# Patient Record
Sex: Male | Born: 1995 | Race: White | Hispanic: No | Marital: Single | Smoking: Never smoker
Health system: Southern US, Community
[De-identification: ages and names within clinical notes are randomized; demographics above are authoritative.]

## PROBLEM LIST (undated history)

## (undated) DIAGNOSIS — R569 Unspecified convulsions: Secondary | ICD-10-CM

---
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
## 2016-05-31 ENCOUNTER — Emergency Department (HOSPITAL_COMMUNITY)
Admission: EM | Admit: 2016-05-31 | Discharge: 2016-05-31 | Disposition: A | Discharge status: Home / Self Care | Payer: BLUE CROSS/BLUE SHIELD | Attending: Emergency Medicine | Admitting: Emergency Medicine

## 2016-05-31 ENCOUNTER — Emergency Department (HOSPITAL_COMMUNITY): Payer: BLUE CROSS/BLUE SHIELD

## 2016-05-31 ENCOUNTER — Encounter (HOSPITAL_COMMUNITY): Payer: Self-pay | Admitting: Emergency Medicine

## 2016-05-31 DIAGNOSIS — R000 Tachycardia, unspecified: Secondary | ICD-10-CM

## 2016-05-31 DIAGNOSIS — Z79899 Other long term (current) drug therapy: Secondary | ICD-10-CM | POA: Diagnosis not present

## 2016-05-31 DIAGNOSIS — F419 Anxiety disorder, unspecified: Secondary | ICD-10-CM | POA: Diagnosis not present

## 2016-05-31 DIAGNOSIS — R079 Chest pain, unspecified: Secondary | ICD-10-CM | POA: Diagnosis present

## 2016-05-31 HISTORY — DX: Unspecified convulsions: R56.9

## 2016-05-31 LAB — CBC
HCT: 41 % (ref 39.0–52.0)
Hemoglobin: 14 g/dL (ref 13.0–17.0)
MCH: 31.1 pg (ref 26.0–34.0)
MCHC: 34.1 g/dL (ref 30.0–36.0)
MCV: 91.1 fL (ref 78.0–100.0)
PLATELETS: 209 10*3/uL (ref 150–400)
RBC: 4.5 MIL/uL (ref 4.22–5.81)
RDW: 12.5 % (ref 11.5–15.5)
WBC: 8.1 10*3/uL (ref 4.0–10.5)

## 2016-05-31 LAB — BASIC METABOLIC PANEL
Anion gap: 14 (ref 5–15)
BUN: 21 mg/dL — AB (ref 6–20)
CALCIUM: 8.8 mg/dL — AB (ref 8.9–10.3)
CO2: 24 mmol/L (ref 22–32)
CREATININE: 1.02 mg/dL (ref 0.61–1.24)
Chloride: 100 mmol/L — ABNORMAL LOW (ref 101–111)
GFR calc non Af Amer: >60 mL/min (ref >60)
GLUCOSE: 94 mg/dL (ref 65–99)
Potassium: 3.1 mmol/L — ABNORMAL LOW (ref 3.5–5.1)
Sodium: 138 mmol/L (ref 135–145)

## 2016-05-31 LAB — RAPID URINE DRUG SCREEN, HOSP PERFORMED
Amphetamines: NOT DETECTED
Barbiturates: NOT DETECTED
Benzodiazepines: NOT DETECTED
Cocaine: NOT DETECTED
Opiates: NOT DETECTED
Tetrahydrocannabinol: POSITIVE — AB

## 2016-05-31 LAB — I-STAT TROPONIN, ED: TROPONIN I, POC: 0 ng/mL (ref 0.00–0.08)

## 2016-05-31 MED ORDER — HYDROXYZINE HCL 25 MG PO TABS: 25.0000 mg | ORAL_TABLET | Freq: Four times a day (QID) | ORAL | 0 refills | Status: AC

## 2016-05-31 MED ORDER — ONDANSETRON 4 MG PO TBDP: 4.0000 mg | ORAL_TABLET | Freq: Once | ORAL | Status: DC | PRN

## 2016-05-31 MED ORDER — ONDANSETRON 4 MG PO TBDP
ORAL_TABLET | ORAL | Status: AC
  Filled 2016-05-31: qty 1

## 2016-05-31 MED ORDER — HYDROXYZINE HCL 25 MG PO TABS
25.0000 mg | ORAL_TABLET | Freq: Once | ORAL | Status: AC
  Administered 2016-05-31: 25 mg via ORAL
  Filled 2016-05-31: qty 1

## 2016-05-31 NOTE — ED Provider Notes (Signed)
[facility]-EMERGENCY DEPT Provider Note   CSN: [id number] Arrival date & time: 05/31/16  0124     History   Chief Complaint Chief Complaint  Patient presents with  . Chest Pain  . Insomnia  . Fatigue  . Psychiatric Evaluation    HPI Luke Short is a 21 y.o. male who presents emergency Department with chief complaint of chest pain. He presents with his mother. He also has complaints of anxiety. Patient has had several days of intermittent fleeting chest pain and racing heart with associated lightheadedness. He states that it only lasts for several seconds but he "feels like I'm going to die." The patient has been drinking monster energy drinks and taking pre-workout supplements. His mother states that she is concerned for anxiety and depression. He seems to be socially isolating himself more but denies suicidality, homicidality or visual hallucinations. Patient does use marijuana to try and sleep. However, his sleep patterns have been extremely irregular. Patient states that he has had difficulty sleeping for almost 15 months. The patient denies hemoptysis, unilateral leg swelling, constant chest pain or tachycardia. The patient does have a family history of sudden cardiac death in a grandmother and maternal aunt.  HPI  Past Medical History:  Diagnosis Date  . Seizures (HCC)     There are no active problems to display for this patient.   History reviewed. No pertinent surgical history.     Home Medications    Prior to Admission medications   Not on File    Family History History reviewed. No pertinent family history.  Social History Social History  Substance Use Topics  . Smoking status: Never Smoker  . Smokeless tobacco: Never Used  . Alcohol use No     Allergies   Patient has no known allergies.   Review of Systems Review of Systems Ten systems reviewed and are negative for acute change, except as noted in the HPI.   Physical Exam Updated Vital  Signs BP 126/70   Pulse 65   Temp 97.9 F (36.6 C) (Oral)   Resp 14   Ht 5\' 9"  (1.753 m)   Wt 70.3 kg   SpO2 100%   BMI 22.89 kg/m   Physical Exam  Constitutional: He is oriented to person, place, and time. He appears well-developed and well-nourished. No distress.  HENT:  Head: Normocephalic and atraumatic.  Eyes: Conjunctivae and EOM are normal. Pupils are equal, round, and reactive to light. No scleral icterus.  Mydriasis  Neck: Normal range of motion. Neck supple.  Cardiovascular: Normal rate, regular rhythm and normal heart sounds.   Pulmonary/Chest: Effort normal and breath sounds normal. No respiratory distress.  Abdominal: Soft. There is no tenderness.  Musculoskeletal: He exhibits no edema.  Neurological: He is alert and oriented to person, place, and time.  Skin: Skin is warm and dry. He is not diaphoretic.  Psychiatric: His behavior is normal. His mood appears anxious.  Nursing note and vitals reviewed.    ED Treatments / Results  Labs (all labs ordered are listed, but only abnormal results are displayed) Labs Reviewed  BASIC METABOLIC PANEL - Abnormal; Notable for the following:       Result Value   Potassium 3.1 (*)    Chloride 100 (*)    BUN 21 (*)    Calcium 8.8 (*)    All other components within normal limits  RAPID URINE DRUG SCREEN, HOSP PERFORMED - Abnormal; Notable for the following:    Tetrahydrocannabinol POSITIVE (*)  All other components within normal limits  CBC  ETHANOL  SALICYLATE LEVEL  ACETAMINOPHEN LEVEL  I-STAT TROPOININ, ED    EKG  EKG Interpretation  Date/Time:  Tuesday May 31 2016 01:29:24 EDT Ventricular Rate:  74 PR Interval:  164 QRS Duration: 94 QT Interval:  390 QTC Calculation: 432 R Axis:   108 Text Interpretation:  Normal sinus rhythm with sinus arrhythmia Rightward axis Borderline ECG No previous ECGs available Confirmed by Bebe ShaggyWICKLINE  MD, DONALD ([id number]) on 05/31/2016 3:05:53 AM       Radiology Dg Chest 2  View  Result Date: 05/31/2016 CLINICAL DATA:  Difficulty breathing and chest pain after drinking energy drink. EXAM: CHEST  2 VIEW COMPARISON:  Chest radiograph February 02, 2013 FINDINGS: Cardiomediastinal silhouette is normal. No pleural effusions or focal consolidations. Trachea projects midline and there is no pneumothorax. Soft tissue planes and included osseous structures are non-suspicious. IMPRESSION: Normal chest. Electronically Signed   By: Awilda Metroourtnay  Bloomer M.D.   On: 05/31/2016 02:07    Procedures Procedures (including critical care time)  Medications Ordered in ED Medications  ondansetron (ZOFRAN-ODT) disintegrating tablet 4 mg (not administered)  ondansetron (ZOFRAN-ODT) 4 MG disintegrating tablet (not administered)     Initial Impression / Assessment and Plan / ED Course  I have reviewed the triage vital signs and the nursing notes.  Pertinent labs & imaging results that were available during my care of the patient were reviewed by me and considered in my medical decision making (see chart for details).     Patient EKG without concerning abnormalities such as Wolff-Parkinson-White or Brugada syndrome. Given the patient's history, I have advised patient to have a cardiac workup as soon as possible. Patient given hydroxyzine for his anxiety. He is not suicidal, homicidal, hallucinating. Patient is given outpatient resources to follow up for his anxiety. I have advised patient to stop using energy drinks and pre-workout supplements. He appears safe for discharge at this time  Final Clinical Impressions(s) / ED Diagnoses   Final diagnoses:  Anxiety  Racing heart beat    New Prescriptions New Prescriptions   No medications on file     Arthor Captainbigail Prakash Kimberling, PA-C 05/31/16 0719    Zadie Rhineonald Wickline, MD 05/31/16 2337

## 2016-05-31 NOTE — ED Triage Notes (Signed)
pts mother also requesting psych evaluation for patient regarding the trouble sleeping, depression, and anxiety; denies prior hx of these

## 2016-05-31 NOTE — ED Triage Notes (Signed)
Pt presents with CP that the patient described as "dropped" at 5p last night; pt states he also feels fatigue and cannot sleep because of this episode; mother states the trouble sleeping is an ongoing issue

## 2016-05-31 NOTE — Discharge Instructions (Signed)
Please continue the energy drinks and pre-workout supplements. Please follow up with outpatient testing with cardiology as well as with outpatient psychiatry services. And discharging you with hydroxyzine which may be used for both sleep and anxiety. Please read the information below about reasons to seek immediate medical care.    Your caregiver has diagnosed you as having chest pain that is not specific for one problem, but does not require admission.  You are at low risk for an acute heart condition or other serious illness. Chest pain comes from many different causes.  SEEK IMMEDIATE MEDICAL ATTENTION IF: You have severe chest pain, especially if the pain is crushing or pressure-like and spreads to the arms, back, neck, or jaw, or if you have sweating, nausea (feeling sick to your stomach), or shortness of breath. THIS IS AN EMERGENCY. Don't wait to see if the pain will go away. Get medical help at once. Call 911 or [phone] (operator). DO NOT drive yourself to the hospital.  Your chest pain gets worse and does not go away with rest.  You have an attack of chest pain lasting longer than usual, despite rest and treatment with the medications your caregiver has prescribed.  You wake from sleep with chest pain or shortness of breath.  You feel dizzy or faint.  You have chest pain not typical of your usual pain for which you originally saw your caregiver.

## 2016-05-31 NOTE — ED Notes (Signed)
Vomiting x 1

## 2018-03-21 IMAGING — DX DG CHEST 2V
2 series · 2 of 2 positions shown · non-contrast
Comparison: Chest radiograph February 02, 2013

CLINICAL DATA: Difficulty breathing and chest pain after drinking
energy drink.

EXAM:
CHEST  2 VIEW

[chest pa]
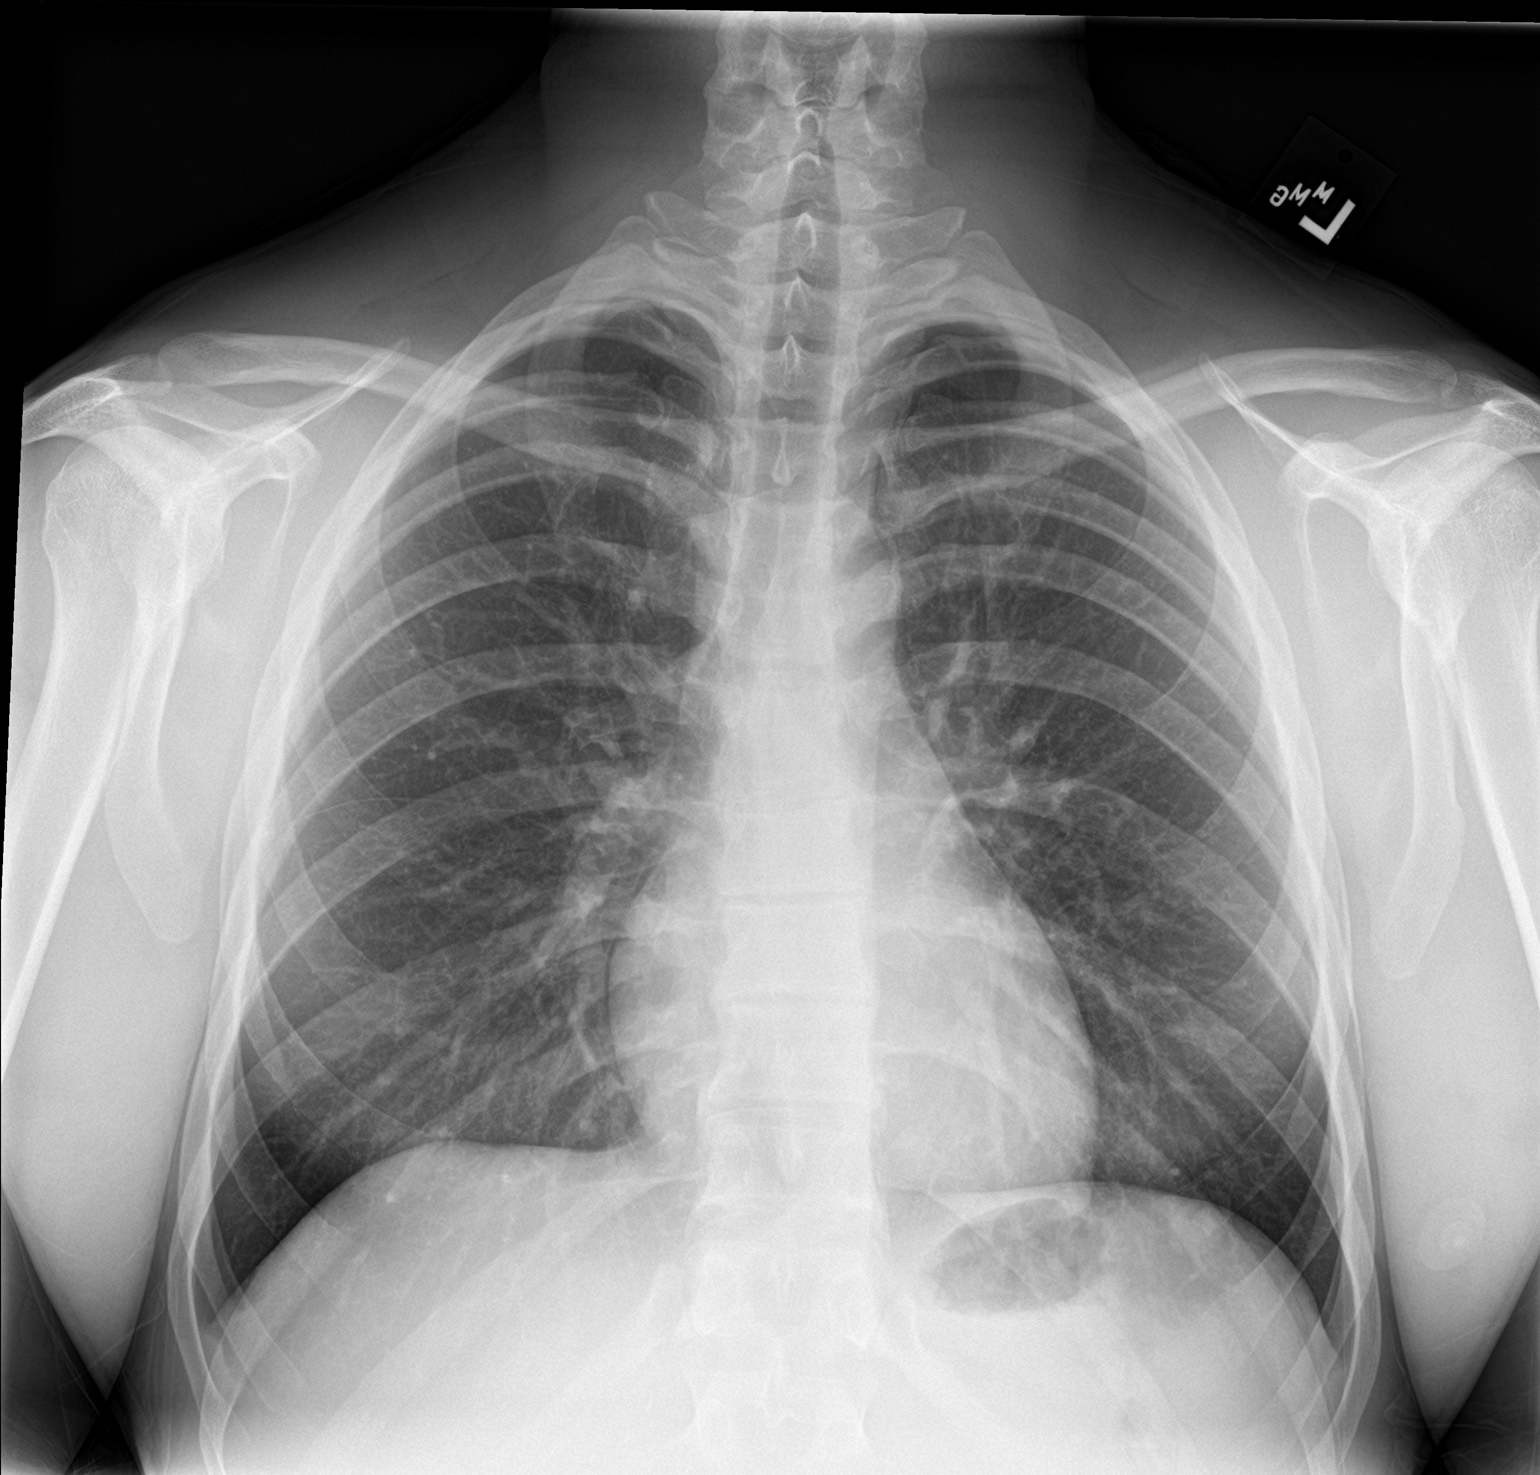

[chest lat]
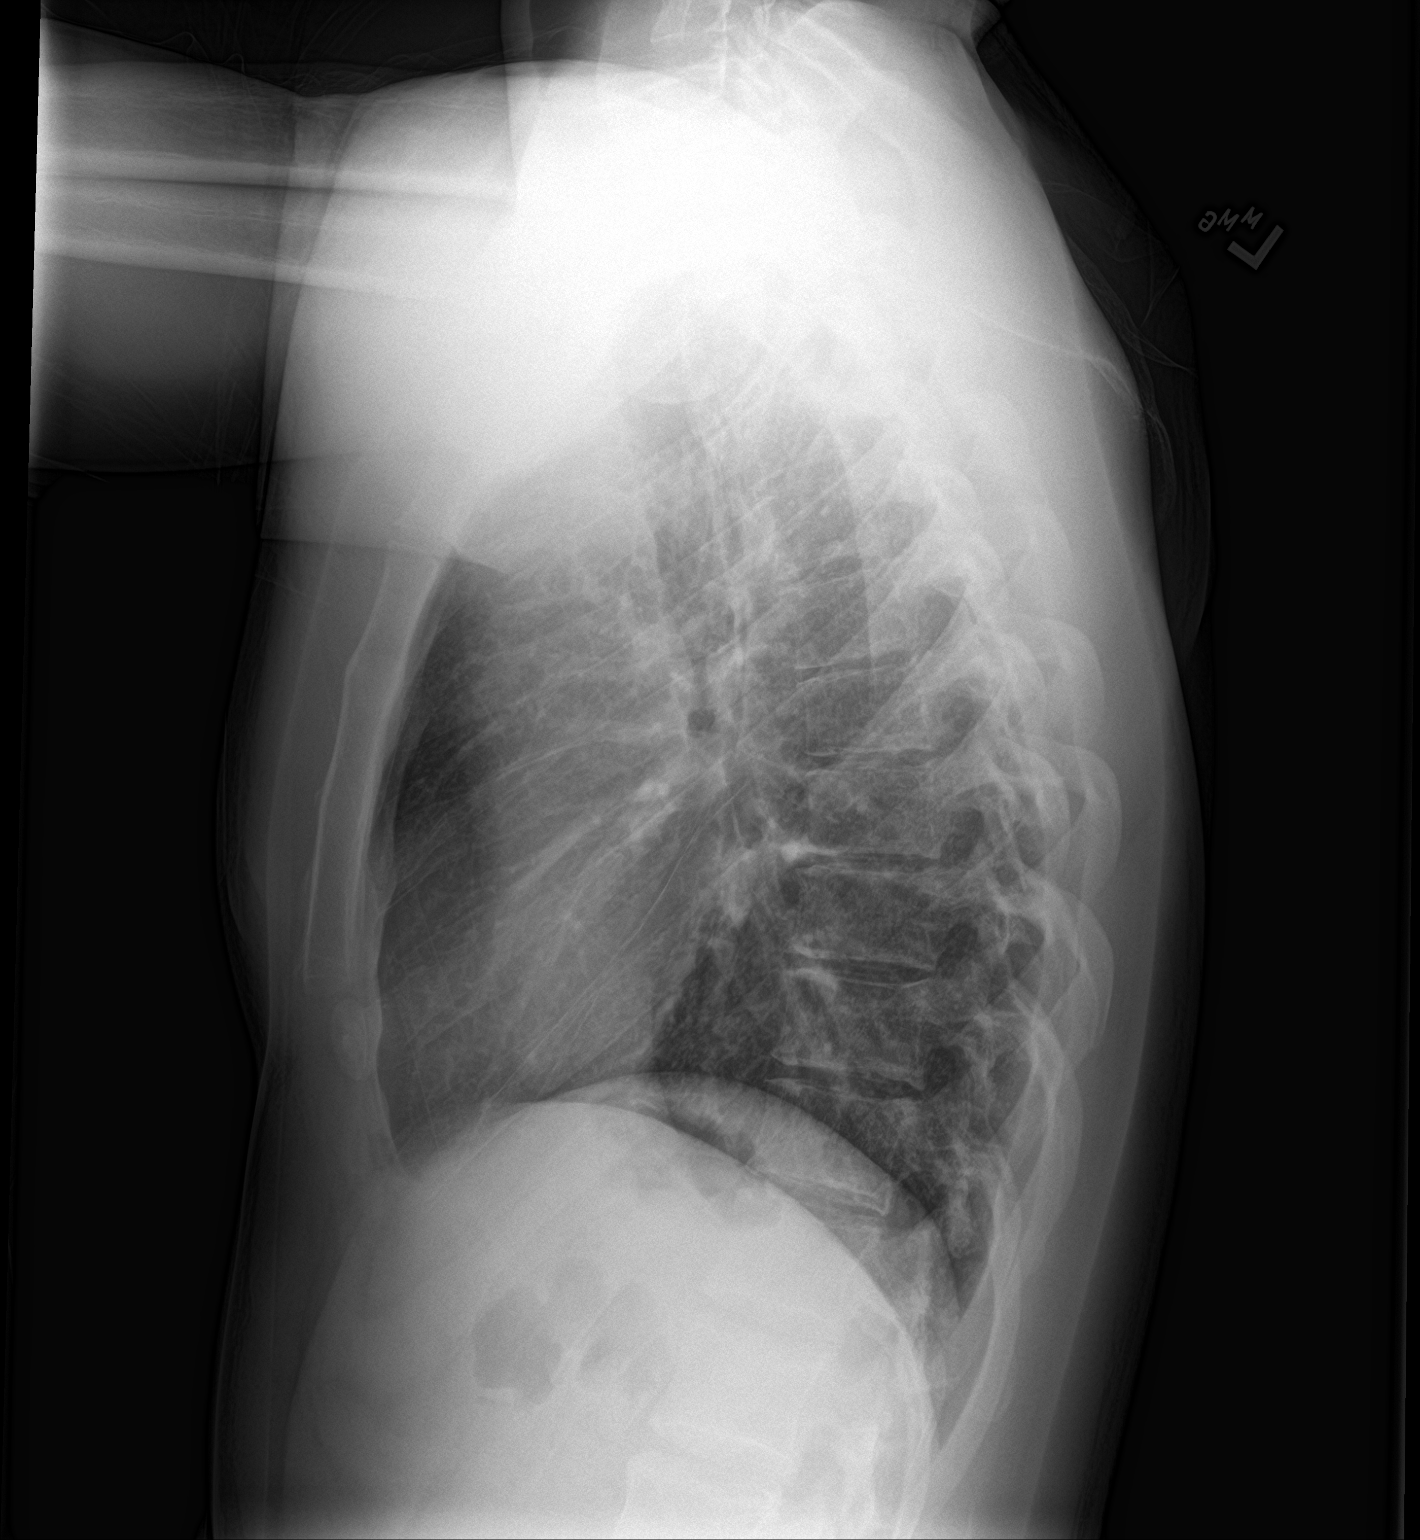

[2 of 2 positions shown; findings below may reference images not displayed]

FINDINGS: Cardiomediastinal silhouette is normal. No pleural effusions or
focal consolidations. Trachea projects midline and there is no
pneumothorax. Soft tissue planes and included osseous structures are
non-suspicious.
IMPRESSION: Normal chest.
